# Patient Record
Sex: Female | Born: 1970 | Race: White | Hispanic: No | Marital: Married
Health system: Southern US, Community
[De-identification: ages and names within clinical notes are randomized; demographics above are authoritative.]

---

## 1999-04-04 ENCOUNTER — Other Ambulatory Visit: Admission: RE | Admit: 1999-04-04 | Discharge: 1999-04-04 | Payer: Self-pay | Admitting: Obstetrics and Gynecology

## 2000-06-10 ENCOUNTER — Other Ambulatory Visit: Admission: RE | Admit: 2000-06-10 | Discharge: 2000-06-10 | Payer: Self-pay | Admitting: Obstetrics and Gynecology

## 2001-05-15 ENCOUNTER — Other Ambulatory Visit: Admission: RE | Admit: 2001-05-15 | Discharge: 2001-05-15 | Payer: Self-pay | Admitting: Obstetrics and Gynecology

## 2002-04-23 ENCOUNTER — Inpatient Hospital Stay (HOSPITAL_COMMUNITY): Admission: AD | Admit: 2002-04-23 | Discharge: 2002-04-25 | Payer: Self-pay | Admitting: Obstetrics and Gynecology

## 2002-05-25 ENCOUNTER — Other Ambulatory Visit: Admission: RE | Admit: 2002-05-25 | Discharge: 2002-05-25 | Payer: Self-pay | Admitting: Obstetrics and Gynecology

## 2003-06-23 ENCOUNTER — Other Ambulatory Visit: Admission: RE | Admit: 2003-06-23 | Discharge: 2003-06-23 | Payer: Self-pay | Admitting: Obstetrics and Gynecology

## 2003-11-02 ENCOUNTER — Other Ambulatory Visit: Admission: RE | Admit: 2003-11-02 | Discharge: 2003-11-02 | Payer: Self-pay | Admitting: Obstetrics and Gynecology

## 2004-07-30 ENCOUNTER — Other Ambulatory Visit: Admission: RE | Admit: 2004-07-30 | Discharge: 2004-07-30 | Payer: Self-pay | Admitting: Obstetrics and Gynecology

## 2004-08-15 ENCOUNTER — Ambulatory Visit (HOSPITAL_COMMUNITY): Admission: RE | Admit: 2004-08-15 | Discharge: 2004-08-15 | Payer: Self-pay | Admitting: Obstetrics and Gynecology

## 2004-08-31 ENCOUNTER — Encounter: Payer: Self-pay | Admitting: Obstetrics and Gynecology

## 2004-09-12 ENCOUNTER — Ambulatory Visit (HOSPITAL_COMMUNITY): Admission: RE | Admit: 2004-09-12 | Discharge: 2004-09-12 | Payer: Self-pay | Admitting: Obstetrics and Gynecology

## 2004-11-24 ENCOUNTER — Emergency Department (HOSPITAL_COMMUNITY): Admission: EM | Admit: 2004-11-24 | Discharge: 2004-11-24 | Payer: Self-pay | Admitting: Emergency Medicine

## 2005-08-27 ENCOUNTER — Other Ambulatory Visit: Admission: RE | Admit: 2005-08-27 | Discharge: 2005-08-27 | Payer: Self-pay | Admitting: Obstetrics and Gynecology

## 2006-01-21 ENCOUNTER — Inpatient Hospital Stay (HOSPITAL_COMMUNITY): Admission: AD | Admit: 2006-01-21 | Discharge: 2006-01-26 | Payer: Self-pay | Admitting: Obstetrics and Gynecology

## 2006-01-21 ENCOUNTER — Ambulatory Visit: Payer: Self-pay | Admitting: Neonatology

## 2006-01-22 ENCOUNTER — Encounter (INDEPENDENT_AMBULATORY_CARE_PROVIDER_SITE_OTHER): Payer: Self-pay | Admitting: *Deleted

## 2006-01-27 ENCOUNTER — Encounter: Admission: RE | Admit: 2006-01-27 | Discharge: 2006-02-25 | Payer: Self-pay | Admitting: Obstetrics and Gynecology

## 2006-02-26 ENCOUNTER — Encounter: Admission: RE | Admit: 2006-02-26 | Discharge: 2006-03-28 | Payer: Self-pay | Admitting: Obstetrics and Gynecology

## 2006-03-29 ENCOUNTER — Encounter: Admission: RE | Admit: 2006-03-29 | Discharge: 2006-04-28 | Payer: Self-pay | Admitting: Obstetrics and Gynecology

## 2006-04-29 ENCOUNTER — Encounter: Admission: RE | Admit: 2006-04-29 | Discharge: 2006-05-10 | Payer: Self-pay | Admitting: Obstetrics and Gynecology

## 2007-04-01 ENCOUNTER — Encounter: Admission: RE | Admit: 2007-04-01 | Discharge: 2007-04-01 | Payer: Self-pay | Admitting: Obstetrics and Gynecology

## 2007-11-05 ENCOUNTER — Ambulatory Visit: Payer: Self-pay | Admitting: Family Medicine

## 2007-11-05 DIAGNOSIS — R5381 Other malaise: Secondary | ICD-10-CM

## 2007-11-05 DIAGNOSIS — R51 Headache: Secondary | ICD-10-CM

## 2007-11-05 DIAGNOSIS — R5383 Other fatigue: Secondary | ICD-10-CM

## 2007-11-05 DIAGNOSIS — R519 Headache, unspecified: Secondary | ICD-10-CM | POA: Insufficient documentation

## 2007-11-05 LAB — CONVERTED CEMR LAB
ALT: 9 units/L (ref 0–35)
AST: 13 units/L (ref 0–37)
Albumin: 4.5 g/dL (ref 3.5–5.2)
Alkaline Phosphatase: 51 units/L (ref 39–117)
BUN: 14 mg/dL (ref 6–23)
CO2: 26 meq/L (ref 19–32)
Calcium: 9.5 mg/dL (ref 8.4–10.5)
Chloride: 104 meq/L (ref 96–112)
Cholesterol: 149 mg/dL (ref 0–200)
Creatinine, Ser: 0.8 mg/dL (ref 0.40–1.20)
Glucose, Bld: 91 mg/dL (ref 70–99)
HCT: 43.4 % (ref 36.0–46.0)
HDL: 66 mg/dL (ref >39)
Hemoglobin: 14.1 g/dL (ref 12.0–15.0)
LDL Cholesterol: 72 mg/dL (ref 0–99)
MCHC: 32.5 g/dL (ref 30.0–36.0)
MCV: 93.7 fL (ref 78.0–100.0)
Platelets: 260 10*3/uL (ref 150–400)
Potassium: 4.1 meq/L (ref 3.5–5.3)
RBC: 4.63 M/uL (ref 3.87–5.11)
RDW: 12.7 % (ref 11.5–15.5)
Sed Rate: 4 mm/hr (ref 0–22)
Sodium: 140 meq/L (ref 135–145)
Total Bilirubin: 0.4 mg/dL (ref 0.3–1.2)
Total CHOL/HDL Ratio: 2.3
Total Protein: 7 g/dL (ref 6.0–8.3)
Triglycerides: 54 mg/dL (ref <150)
VLDL: 11 mg/dL (ref 0–40)
Vitamin B-12: 542 pg/mL (ref 211–911)
WBC: 5.3 10*3/uL (ref 4.0–10.5)

## 2007-11-06 ENCOUNTER — Encounter: Payer: Self-pay | Admitting: Family Medicine

## 2008-09-28 ENCOUNTER — Ambulatory Visit: Payer: Self-pay | Admitting: Family Medicine

## 2008-09-28 DIAGNOSIS — J45909 Unspecified asthma, uncomplicated: Secondary | ICD-10-CM | POA: Insufficient documentation

## 2008-09-28 DIAGNOSIS — J209 Acute bronchitis, unspecified: Secondary | ICD-10-CM

## 2010-09-08 ENCOUNTER — Encounter: Payer: Self-pay | Admitting: Obstetrics and Gynecology

## 2010-12-16 ENCOUNTER — Other Ambulatory Visit: Payer: Self-pay | Admitting: Family Medicine

## 2010-12-16 ENCOUNTER — Encounter: Payer: Self-pay | Admitting: Family Medicine

## 2010-12-16 ENCOUNTER — Ambulatory Visit
Admission: RE | Admit: 2010-12-16 | Discharge: 2010-12-16 | Disposition: A | Discharge status: Home / Self Care | Payer: Commercial Indemnity | Source: Ambulatory Visit | Attending: Family Medicine | Admitting: Family Medicine

## 2010-12-16 ENCOUNTER — Inpatient Hospital Stay (INDEPENDENT_AMBULATORY_CARE_PROVIDER_SITE_OTHER)
Admission: RE | Admit: 2010-12-16 | Discharge: 2010-12-16 | Disposition: A | Discharge status: Home / Self Care | Payer: Commercial Indemnity | Source: Ambulatory Visit | Attending: Family Medicine | Admitting: Family Medicine

## 2010-12-16 DIAGNOSIS — M25529 Pain in unspecified elbow: Secondary | ICD-10-CM

## 2010-12-16 DIAGNOSIS — M25429 Effusion, unspecified elbow: Secondary | ICD-10-CM

## 2011-01-04 NOTE — Op Note (Signed)
[name], [name]                 ACCOUNT NO.:  [id number]   MEDICAL RECORD NO.:  [id number]          PATIENT TYPE:  INP   LOCATION:  9108                          FACILITY:  [facility]   PHYSICIAN:  Guy Sandifer. Henderson Cloud, M.D. DATE OF BIRTH:  08/05/1971   DATE OF PROCEDURE:  01/22/2006  DATE OF DISCHARGE:                                 OPERATIVE REPORT   PREOPERATIVE DIAGNOSES:  1.  Twin gestation at 63 weeks' estimated additional age.  2.  Unstable lie for baby B.   POSTOPERATIVE DIAGNOSES:  1.  Twin gestation at 30 weeks' estimated additional age.  2.  Unstable lie for baby B.   PROCEDURE:  Low transverse cesarean section.   SURGEON:  Guy Sandifer. Henderson Cloud, M.D.   ANESTHESIA:  Epidural.   ESTIMATED BLOOD LOSS:  800 mL.   SPECIMENS:  Placentas to pathology.   FINDINGS:  A:  Viable female infant, Apgars of 9 and 9 at one and five  minutes, Birth weight arterial cord pH pending.  B:  Viable female infant,  Apgars of 7 and 9 at one and five minutes. respectively.  Birth weight and  arterial cord pH pending.   INDICATIONS AND CONSENT:  This patient is a 40 year old married white female  G3, P1, at 32-0/7 weeks, who on ultrasound yesterday was noted to have  polyhydramnios of twin B.  Baby B is also smaller than twin A.  Baby A's  estimated fetal weight was 57th percentile, baby B was 15th percentile  estimated fetal weight.  The patient was found to be contracting and was  admitted to the hospital for magnesium sulfate tocolysis.  She subsequently  continued to contract and had spontaneous rupture of membranes for clear  fluid.  She was on Unasyn and had received one dose of betamethasone.  On  examination on the day of surgery she was 7 cm dilated, completely effaced  and -1 station and baby A was vertex.  Ultrasound at the bedside revealed  the baby B to be oblique to almost transverse and back down, with copious  amounts of fluid.  Careful discussion for modes of delivery was  undertaken.  Due to the early gestational age and unstable lie for baby B, cesarean  section is recommended.  Potential risks and complications are reviewed  preoperatively including but not limited to infection, bowel, bladder or  ureteral damage, bleeding requiring transfusion of blood products with  possible transfusion reaction, HIV and hepatitis acquisition, DVT, PE,  pneumonia.  All questions were answered and consent is signed on the chart.   PROCEDURE:  The patient is taken to operating room, where an epidural  anesthetic is augmented to a surgical level.  Foley catheter is already in  place.  The patient is placed in the dorsal supine position with a 15 degree  left lateral wedge.  She is then prepped and draped in a sterile fashion.  After testing for adequate epidural anesthesia, the skin is entered through  a Pfannenstiel incision and dissection is carried out layers to the  peritoneum.  Peritoneum is incised and extended  superiorly and inferiorly.  Vesicouterine peritoneum is taken down cephalolaterally, the bladder flap is  developed and the bladder blade is placed.  The uterus is incised in a low  transverse manner.  The uterine cavity is entered bluntly with a hemostat.  The uterine incision is then extended cephalolaterally with the fingers.  Baby A is then delivered from the vertex presentation.  Good cry and tone is  noted.  The cord is clamped and cut and the baby is handed to the waiting  pediatrics team.  Artificial rupture of membranes on baby B is carried out  for a large amount of fluid.  It is then delivered from the vertex  presentation.  Cry and tone is noted.  Cord is clamped and cut.  The baby is  handed to the waiting pediatrics team.  Arterial blood gas and cord bloods  are drawn.  The cord for baby B is marked with a plastic cord clamp.  Placentas are manually delivered and sent to pathology.  The uterus was  exteriorized.  The cavity is clean.  The uterus  is closed in two running  locking imbricating layers of 0 Monocryl suture.  A figure-of-eight is  placed on the lateral right one-third of the incision to obtain hemostasis.  Right tube and ovary is normal.  The left ovary is normal.  The fimbriae of  the left fallopian tube are clubbed.  There is also a single adhesion at the  end of the left fallopian tube to the omentum.  This is doubly clamped with  hemostats and taken down.  Both pedicles are ligated with 2-0 chromic  suture.  The uterus is returned to the abdomen.  Copious irrigation is  carried out.  Good hemostasis was noted.  The anterior peritoneum is closed  in running fashion with 0 Monocryl suture, which is also used to  reapproximate the pyramidalis muscle in the midline.  Fascia is closed in a  running fashion with 0 PDS suture and the skin is closed with clips.  All  sponge, instrument and needle counts are correct and the patient is  transferred to the recovery room in stable condition.      Guy Sandifer Henderson Cloud, M.D.  Electronically Signed     JET/MEDQ  D:  01/22/2006  T:  01/22/2006  Job:  [id number]

## 2011-01-04 NOTE — Discharge Summary (Signed)
[name], [name]                 ACCOUNT NO.:  [id number]   MEDICAL RECORD NO.:  [id number]          PATIENT TYPE:  INP   LOCATION:  9108                          [facility]ILITY[facility]  [facility]   PHYSICIAN:  Duke Salvia. Marcelle Overlie, M.D.DATE OF BIRTH:  03-25-1971   DATE OF ADMISSION:  01/21/2006  DATE OF DISCHARGE:  01/26/2006                                 DISCHARGE SUMMARY   ADMISSION DIAGNOSES:  1.  Intrauterine pregnancy at 32 weeks estimated gestational age.  2.  Twin gestation.  3.  Preterm labor.   DISCHARGE DIAGNOSES:  1.  Status post low transverse cesarean section.  2.  Viable female infant.   PROCEDURE:  Primary low transverse cesarean section.   REASON FOR ADMISSION:  Please see written H&P.   HOSPITAL COURSE:  The patient is a 40 year old white married female Gravida  3, Para 1, that was admitted to [facility] at 32 weeks  estimated gestational age in preterm labor.  The patient had been seen in  the office where an ultrasound had been performed which had revealed some  polyhydramnios on baby B.  Baby B was also noted to be the smaller of the 2  twins.  Baby A's estimated fetal weight was in the 57th percentile.  Baby B  was in the 15th percentile.  The patient was found to be contracting and was  admitted to the hospital for magnesium sulfate tocolysis.  The patient  continued to contract and sustained spontaneous rupture of membranes.  Fluid  was noted to be clear.  The patient was placed on intravenous antibiotics  and had received 1 dose of betamethasone.  On the day of the surgery, the  patient was examined and found to be 7cm dilated, completely effaced with  baby A which was vertex at a -1 station.  Ultrasound was performed which  revealed baby B to be oblique almost in the transverse back down  presentation.  Baby continued to have copious amounts of amniotic fluid.  Decision was then made to proceed with a primary low transverse cesarean  section due to  early gestational age and unstable lie for baby B.  The  patient was then transferred to the operating room where an epidural was  dosed to an adequate surgical level.  A low transverse incision was made.  The delivery of a viable female, baby A, weighed 3 pounds 15 ounces without  Apgar's  9 at 1 minute and 9 at 5 minutes.  Baby B, also female, was  delivered weighing 3 pounds 5 ounces with Apgar's of 7 at 1 minute and 9 at  5 minutes.  The babies were taken to the NICU and the mother tolerated the  procedure well.  She was taken to the recovery room in stable condition.  Later that evening, the patient was feeling somewhat different.  She stated  she felt tired, but she had some vivid dreams and experienced some nausea  and vomiting and was having some difficulty focusing.   PHYSICAL EXAMINATION ON ADMISSION:  VITAL SIGNS:  Her vital signs were  stable.  She was afebrile.  Oxygen saturation was 96% on room air.  LUNGS:  Lungs were clear to auscultation.  HEART:  Heart was regular rate and rhythm.  ABDOMEN:  Abdomen soft with good return of bowel function.  EXTREMITIES: Deep tendon reflexes were 3 + 1 Beta clona.  Urine output was  good.  PIH (pregnancy induced hypertension) labs were drawn and intravenous  fluid bolus was given.   POSTOPERATIVE EXAMINATIONS:  GENERAL:  On the following morning, the patient  did feel better.  She had ambulated to the bathroom without dizziness.  VITAL SIGNS: Vital signs remained stable.  She was afebrile.  ABDOMEN: Abdomen was soft.  GYN: Fundus was firm and nontender.  SKIN:  Abdominal dressing was clean, dry, and intact.   LABORATORY DATA:  Laboratory findings revealed hemoglobin stable at 8.7,  platelet count of 205,000, WBC count of 11.9.  On postoperative day #2, the  babies were stable in the NICU.  The mother was without complaint.  Vital  signs were stable.  Abdomen soft with good return of bowel function.  Fundus  was firm and nontender.   Incision was clean, dry, and intact.  The patient  was ambulating well.  On postoperative day #3, the patient remained stable.  Vital signs were good.  She was afebrile.  Incision was clean, dry, and  intact.  Fundus firm.  On postoperative day #4, the patient was without  complaint.  Vital signs were stable.  Fundus firm and nontender.  Incision  was clean, dry, and intact.  Staples were removed then the patient was  discharged home.   CONDITION ON DISCHARGE:  Good.   DISCHARGE DIET:  Regular as tolerated.   DISCHARGE ACTIVITIES:  No heavy lifting, no driving x2 weeks, no vaginal  entry.   FOLLOW UP:  Patient is to follow up in the office in 1-2 weeks for an  incision check.  She is to call for temperature greater than 100 degrees,  persistent nausea or vomiting, heavy vaginal bleeding and/or redness or  drainage from the incisional site.   DISCHARGE MEDICATIONS:  1.  Ibuprofen 600 mg every 6 hours.  2.  Prenatal vitamins 1 by mouth daily.  3.  Colace 1 by mouth daily as needed.  4.  Darvocet-N 100 #30, one by mouth every 4-6 hours as needed for pain.      Julio Sicks, N.P.      Richard M. Marcelle Overlie, M.D.  Electronically Signed    CC/MEDQ  D:  02/14/2006  T:  02/14/2006  Job:  [id number]   cc:   Duke Salvia. Marcelle Overlie, M.D.  Fax: [phone]

## 2011-01-04 NOTE — H&P (Signed)
[name], [name]                 ACCOUNT NO.:  [id number]   MEDICAL RECORD NO.:  [id number]          PATIENT TYPE:  MAT   LOCATION:  MATC                          [facility]ILITY:  [facility]   PHYSICIAN:  Dineen Kid. Rana Snare, M.D.    DATE OF BIRTH:  [birth year]   DATE OF ADMISSION:  01/21/2006  DATE OF DISCHARGE:                                HISTORY & PHYSICAL   HISTORY OF PRESENT ILLNESS:  Ms. [name] is a 40 year old G3, P1 at 30 and  six-sevenths weeks who was seen today by Dr. Sherrie George for ultrasound  evaluation of twin pregnancy and polyhydramnios of twin B.  Ultrasound  evaluations today showed fetus #1 on the maternal left in cephalic  presentation with an estimated fetal weight of 57th percentile and normal  amniotic fluid.  Fetus #2 presentation was not documented but previously had  been transverse.  Has an estimated fetal weight of 3 pounds 0 ounces which  gives the baby 15th percentile for fetal weight.  There is a mild to  moderate polyhydramnios noted today, normal umbilical artery ratio of 3.58.  The patient noted to have some irritability versus contractions on fetal  monitoring.  Cervical exam was performed by Dr. Sherrie George showing that cervix  is 3 cm, 75%, and a -3 station.  She was sent to [facility] for  tocolysis and evaluation of preterm labor.  Her pregnancy has been  complicated by marginal previa with the latest ultrasound showing this has  resolved.  The pregnancy is a diamniotic-dichorionic in vitro pregnancy with  an estimated date of confinement of March 19, 2006.  She also had advanced  maternal age with a normal first trimester screen and had declined  amniocentesis.   PAST MEDICAL HISTORY:  Negative.   PAST SURGICAL HISTORY:  Also negative.   PAST OBSTETRICAL HISTORY:  She has had a vaginal delivery, 7-pound 14-ounce  baby in 2003.  She also had a termination in 1993.   PHYSICAL EXAMINATION:  VITAL SIGNS:  Blood pressure 110/60.  HEART:  Regular rate and  rhythm.  PELVIC:  Cervix was closed on Jan 15, 2006, but per Dr. Sherrie George today is 3,  75, -3.   IMPRESSION:  Intrauterine twin pregnancies at 68 and six-sevenths weeks with  cervical change and preterm labor.   PLAN:  Magnesium sulfate tocolysis of labor.  Betamethasone for lung  maturation, 12.5 mg IM x2.  Will go ahead and check a group B strep culture  and place her empirically on IV Unasyn.  Plan bedrest and tocolysis of  labor.  Will continue to monitor the fetuses twice weekly as fetus B does  have polyhydramnios with a 15th percentile estimated fetal weight but  appears to be no evidence of fetal compromise.      Dineen Kid Rana Snare, M.D.  Electronically Signed     DCL/MEDQ  D:  01/21/2006  T:  01/21/2006  Job:  [id number]

## 2011-03-28 ENCOUNTER — Other Ambulatory Visit: Payer: Self-pay | Admitting: Family Medicine

## 2011-03-28 ENCOUNTER — Ambulatory Visit (INDEPENDENT_AMBULATORY_CARE_PROVIDER_SITE_OTHER): Payer: Commercial Indemnity | Admitting: Family Medicine

## 2011-03-28 DIAGNOSIS — N760 Acute vaginitis: Secondary | ICD-10-CM

## 2011-03-28 DIAGNOSIS — N390 Urinary tract infection, site not specified: Secondary | ICD-10-CM

## 2011-03-28 LAB — POCT URINALYSIS DIPSTICK
Blood, UA: NEGATIVE
Glucose, UA: NEGATIVE
Leukocytes, UA: NEGATIVE
Nitrite, UA: NEGATIVE
Spec Grav, UA: >1.03
Urobilinogen, UA: 0.2

## 2011-03-28 NOTE — Progress Notes (Signed)
  Subjective:    Patient ID: [name], female    DOB: 1970-08-29, 40 y.o.   MRN: [id number]  HPI  Itching and some burning x 2-3 days  Review of Systems     Objective:   Physical Exam        Assessment & Plan:

## 2011-03-28 NOTE — Progress Notes (Signed)
  Subjective:    Patient ID: [name], female    DOB: 1971-01-25, 40 y.o.   MRN: [id number]  HPI  Here for viginal itching and dysuria for a couple of days. Recently got off of vacation.  She thinks may have a UTI.   Review of Systems     Objective:   Physical Exam        Assessment & Plan:  UA only + for protein. Will send a culture. Wet prep collected for vaginitis. Should have the results back tomorrow.

## 2011-03-29 LAB — WET PREP, GENITAL
Clue Cells Wet Prep HPF POC: NONE SEEN
Trich, Wet Prep: NONE SEEN
WBC, Wet Prep HPF POC: NONE SEEN
Yeast Wet Prep HPF POC: NONE SEEN

## 2011-03-30 ENCOUNTER — Telehealth: Payer: Self-pay | Admitting: Family Medicine

## 2011-03-30 LAB — URINE CULTURE: Colony Count: 9000

## 2011-03-30 NOTE — Telephone Encounter (Signed)
Call pt: Wet prep and urine cx is neg.

## 2011-04-01 NOTE — Telephone Encounter (Signed)
Pt notified of results. KJ LPN 

## 2011-04-18 ENCOUNTER — Other Ambulatory Visit: Payer: Self-pay | Admitting: Obstetrics and Gynecology

## 2011-04-18 DIAGNOSIS — R928 Other abnormal and inconclusive findings on diagnostic imaging of breast: Secondary | ICD-10-CM

## 2011-04-25 ENCOUNTER — Ambulatory Visit
Admission: RE | Admit: 2011-04-25 | Discharge: 2011-04-25 | Disposition: A | Discharge status: Home / Self Care | Payer: Commercial Indemnity | Source: Ambulatory Visit | Attending: Obstetrics and Gynecology | Admitting: Obstetrics and Gynecology

## 2011-04-25 DIAGNOSIS — R928 Other abnormal and inconclusive findings on diagnostic imaging of breast: Secondary | ICD-10-CM

## 2011-07-22 NOTE — Progress Notes (Signed)
Summary: elbow injury/TM (rm 4)   Vital Signs:  Patient Profile:   40 Years Old Female CC:      left arm pain post fall yesterday Height:     64 inches Weight:      130 pounds O2 Sat:      98 % O2 treatment:    Room Air Temp:     99.4 degrees F oral Pulse rate:   73 / minute Resp:     14 per minute BP sitting:   113 / 72  (right arm) Cuff size:   regular  Vitals Entered By: Lajean Saver RN (December 16, 2010 1:48 PM)                  Updated Prior Medication List: ORTHO TRI-CYCLEN (28) 0.035 MG TABS (NORGESTIMATE-ETHINYL ESTRADIOL) 1 qd  Current Allergies: No known allergies History of Present Illness Chief Complaint: left arm pain post fall yesterday History of Present Illness:  Subjective:  Patient fell in shower yesterday, striking her left elbow.  She complains of persistent pain in her left elbow, and has difficulty fully extending.  REVIEW OF SYSTEMS Constitutional Symptoms      Denies fever, chills, night sweats, weight loss, weight gain, and fatigue.  Eyes       Denies change in vision, eye pain, eye discharge, glasses, contact lenses, and eye surgery. Ear/Nose/Throat/Mouth       Denies hearing loss/aids, change in hearing, ear pain, ear discharge, dizziness, frequent runny nose, frequent nose bleeds, sinus problems, sore throat, hoarseness, and tooth pain or bleeding.  Respiratory       Denies dry cough, productive cough, wheezing, shortness of breath, asthma, bronchitis, and emphysema/COPD.  Cardiovascular       Denies murmurs, chest pain, and tires easily with exhertion.    Gastrointestinal       Denies stomach pain, nausea/vomiting, diarrhea, constipation, blood in bowel movements, and indigestion. Genitourniary       Denies painful urination, kidney stones, and loss of urinary control. Neurological       Denies paralysis, seizures, and fainting/blackouts. Musculoskeletal       Complains of muscle pain, joint pain, joint stiffness, and decreased range  of motion.      Denies redness, swelling, muscle weakness, and gout.      Comments: left arm Skin       Denies bruising, unusual mles/lumps or sores, and hair/skin or nail changes.  Psych       Denies mood changes, temper/anger issues, anxiety/stress, speech problems, depression, and sleep problems. Other Comments: Patient fell in shower yesterday hittingher left elbow. She used ice and naproxyn. Her pain is inher upper arm today and she is unable to extend her arm   Past History:  Past Medical History: Unremarkable  Past Surgical History: Reviewed history from 09/28/2008 and no changes required. c/sec 01/2006 Caesarean section  Family History: Reviewed history from 11/05/2007 and no changes required. Father is Deniece Ree with MI at 64  Social History: Reviewed history from 11/05/2007 and no changes required. Print production planner at [employer]. Bachelors degree.  Married to [place] with 3 children.   Never Smoked Alcohol use-no Drug use-no Regular exercise-yes, runs for 30 min 3 days a week   Objective: Appearance:  Patient appears healthy, stated age, and in no acute distress  Left elbow:  No swelling or deformity.  Has difficulty fully extending actively.  Mild tenderness in olecranon area.  Distal neurovascular intact  X-ray left elbow:  IMPRESSION: No  definite fracture but the presence of joint effusion suggests an occult fracture presumably of the radial head. Assessment New Problems: EFFUSION OF UPPER ARM JOINT (ICD-719.02) ELBOW PAIN, LEFT (ICD-719.42)  SUSPECT LEFT RADIAL HEAD FRACTURE  Plan New Orders: T-DG Elbow Complete*L* [73080] Ulner Gutter Splint [29125] Slings- All Types [A4565] Services provided After hours-Weekends-Holidays [99051] Est. Patient Level IV [16109] Planning Comments:   Applied gutter splint and sling. Continue Naproxen.  Continue ice packs.   Arrange follow-up with orthopedist as soon as possible   The patient and/or caregiver has been  counseled thoroughly with regard to medications prescribed including dosage, schedule, interactions, rationale for use, and possible side effects and they verbalize understanding.  Diagnoses and expected course of recovery discussed and will return if not improved as expected or if the condition worsens. Patient and/or caregiver verbalized understanding.   Orders Added: 1)  T-DG Elbow Complete*L* [73080] 2)  Ulner Gutter Splint [29125] 3)  Slings- All Types [A4565] 4)  Services provided After hours-Weekends-Holidays [99051] 5)  Est. Patient Level IV [60454]

## 2012-06-02 ENCOUNTER — Other Ambulatory Visit: Payer: Self-pay | Admitting: Obstetrics and Gynecology

## 2013-06-09 ENCOUNTER — Other Ambulatory Visit: Payer: Self-pay | Admitting: Obstetrics and Gynecology

## 2013-06-15 ENCOUNTER — Other Ambulatory Visit: Payer: Self-pay | Admitting: Obstetrics and Gynecology

## 2013-06-15 DIAGNOSIS — R928 Other abnormal and inconclusive findings on diagnostic imaging of breast: Secondary | ICD-10-CM

## 2013-07-01 ENCOUNTER — Ambulatory Visit
Admission: RE | Admit: 2013-07-01 | Discharge: 2013-07-01 | Disposition: A | Discharge status: Home / Self Care | Payer: BC Managed Care – PPO | Source: Ambulatory Visit | Attending: Obstetrics and Gynecology | Admitting: Obstetrics and Gynecology

## 2013-07-01 DIAGNOSIS — R928 Other abnormal and inconclusive findings on diagnostic imaging of breast: Secondary | ICD-10-CM

## 2013-07-06 ENCOUNTER — Other Ambulatory Visit: Payer: Commercial Indemnity

## 2014-07-19 ENCOUNTER — Other Ambulatory Visit: Payer: Self-pay | Admitting: Obstetrics and Gynecology

## 2014-07-20 LAB — CYTOLOGY - PAP

## 2014-09-01 IMAGING — MG MM DIGITAL DIAGNOSTIC UNILAT*R*
2 series · 2 of 2 positions shown · non-contrast
Comparison: Multiple priors

CLINICAL DATA: Abnormal screening mammogram.

EXAM:
DIGITAL DIAGNOSTIC  RIGHT MAMMOGRAM

[R MLO]
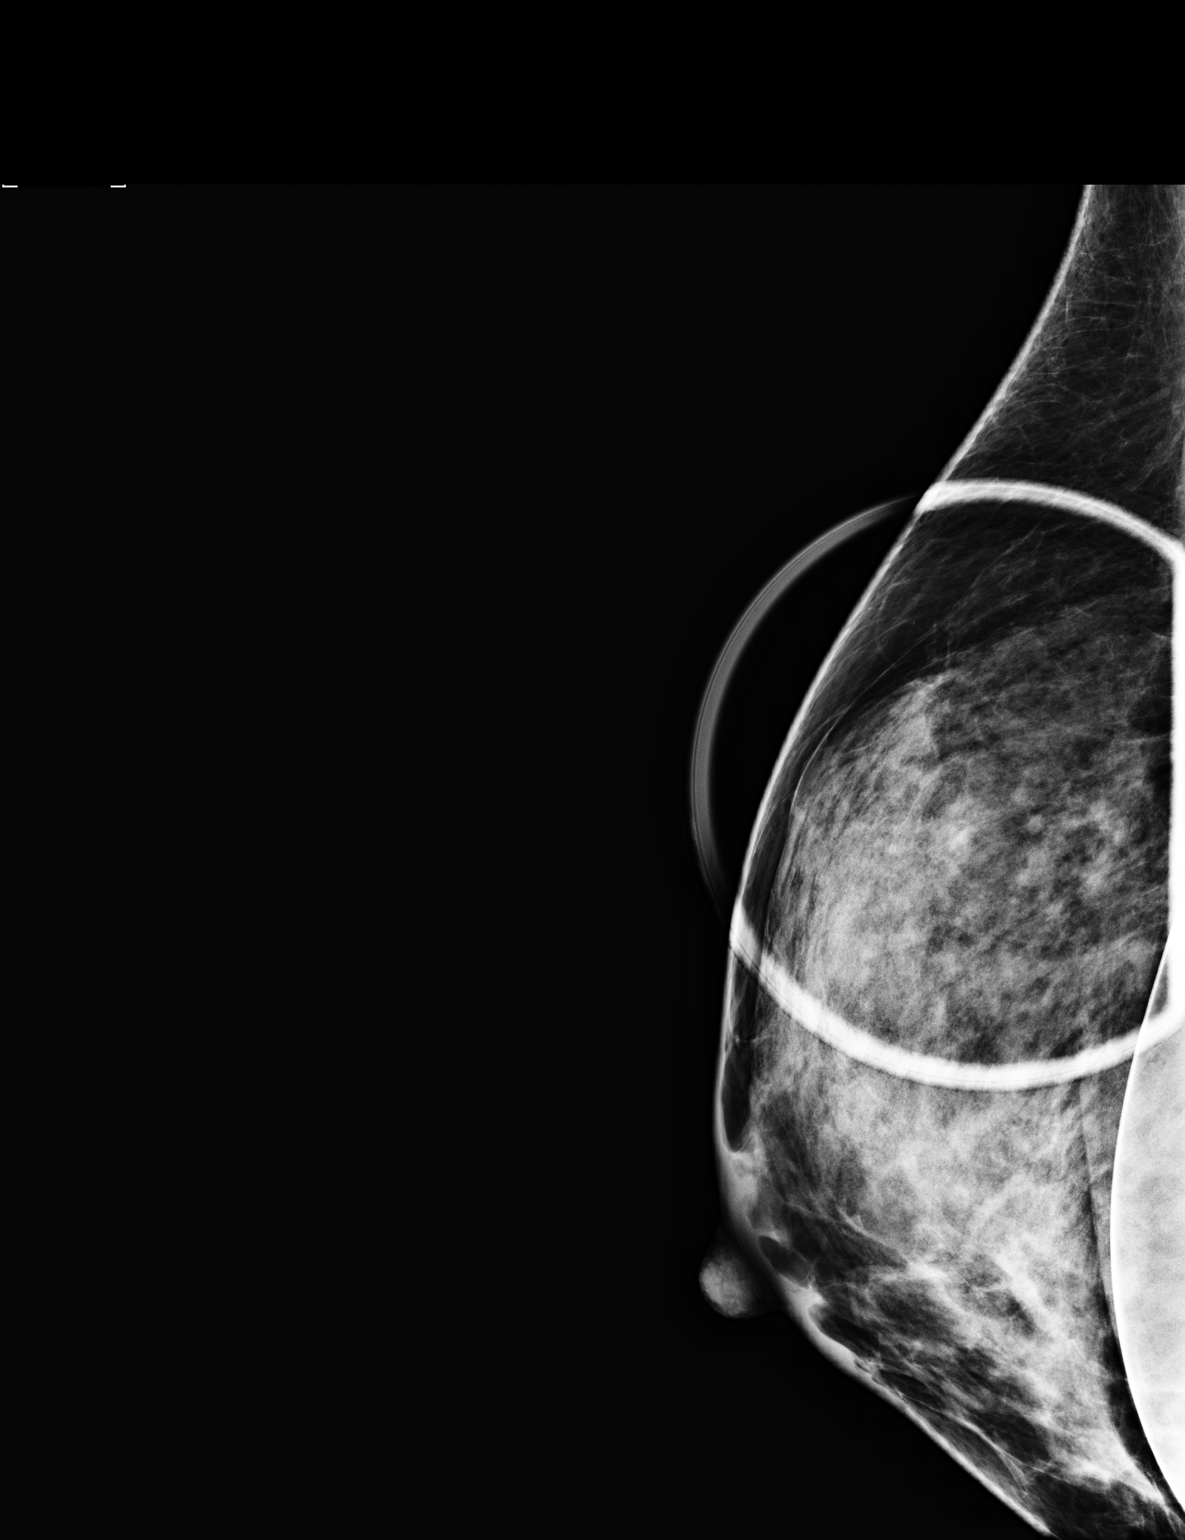

[R ML]
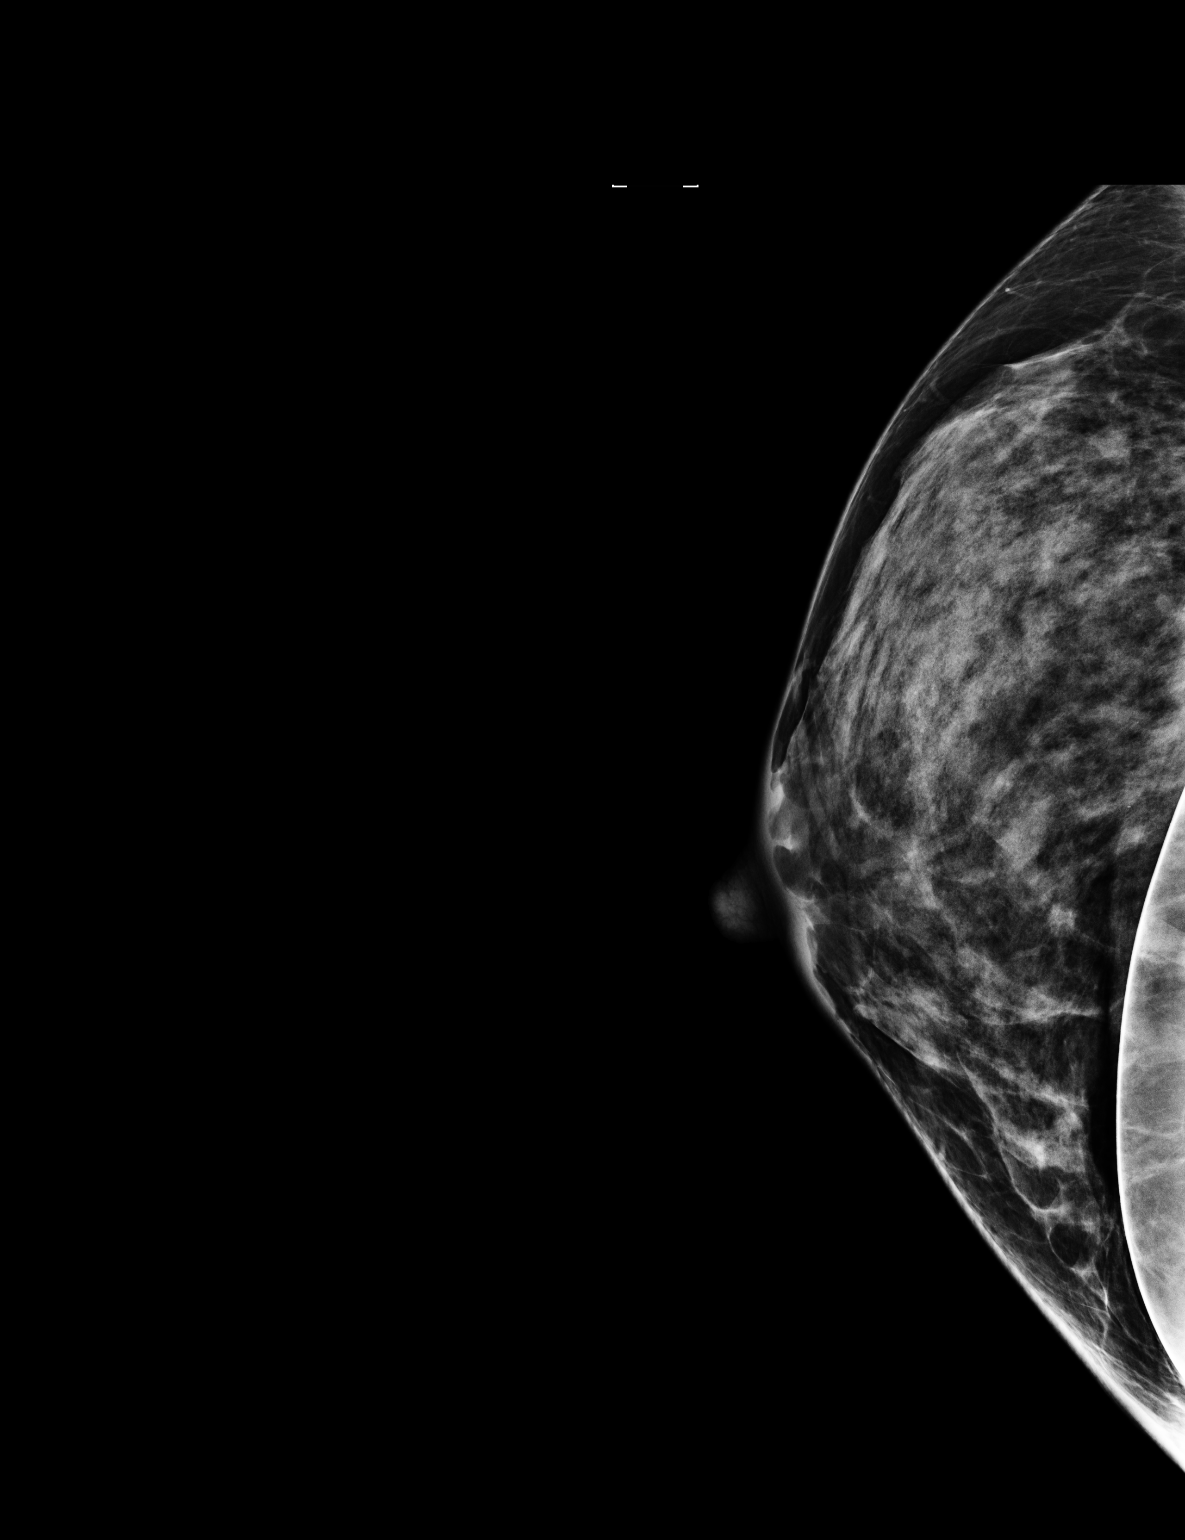

[2 of 2 positions shown; findings below may reference images not displayed]

ACR Breast Density Category c: The breast tissue is heterogeneously
dense, which may obscure small masses.
FINDINGS: The previously questioned mass in the upper right breast disperses
with additional imaging.
IMPRESSION: Negative examination.

RECOMMENDATION:
Screening mammogram in one year.(Code:[id number])

I have discussed the findings and recommendations with the patient.
Results were also provided in writing at the conclusion of the
visit. If applicable, a reminder letter will be sent to the patient
regarding the next appointment.

BI-RADS CATEGORY  1: Negative

## 2020-02-27 ENCOUNTER — Other Ambulatory Visit: Payer: Self-pay

## 2020-02-27 ENCOUNTER — Emergency Department (HOSPITAL_BASED_OUTPATIENT_CLINIC_OR_DEPARTMENT_OTHER)
Admission: EM | Admit: 2020-02-27 | Discharge: 2020-02-28 | Disposition: A | Discharge status: Home / Self Care | Payer: BC Managed Care – PPO | Attending: Emergency Medicine | Admitting: Emergency Medicine

## 2020-02-27 ENCOUNTER — Encounter (HOSPITAL_BASED_OUTPATIENT_CLINIC_OR_DEPARTMENT_OTHER): Payer: Self-pay | Admitting: Emergency Medicine

## 2020-02-27 DIAGNOSIS — R0602 Shortness of breath: Secondary | ICD-10-CM | POA: Diagnosis not present

## 2020-02-27 DIAGNOSIS — R531 Weakness: Secondary | ICD-10-CM | POA: Diagnosis present

## 2020-02-27 DIAGNOSIS — E119 Type 2 diabetes mellitus without complications: Secondary | ICD-10-CM | POA: Diagnosis not present

## 2020-02-27 DIAGNOSIS — T63424A Toxic effect of venom of ants, undetermined, initial encounter: Secondary | ICD-10-CM

## 2020-02-27 DIAGNOSIS — E162 Hypoglycemia, unspecified: Secondary | ICD-10-CM | POA: Insufficient documentation

## 2020-02-27 NOTE — ED Triage Notes (Signed)
Pt reports ant bites to feet yesterday. Reports tingling/numbness. Ambulatory at a steady gait.

## 2020-02-27 NOTE — ED Provider Notes (Signed)
[facility] EMERGENCY DEPARTMENT Provider Note   CSN: [id number] Arrival date & time: 02/27/20  2113     History Chief Complaint  Patient presents with  . Insect Bite    [name] is a 49 y.o. female.  HPI     This a 49 year old female who presents with ant bites.  Patient reports that she was at her son's game in [place] yesterday when she sustained multiple ant bites to the left foot.  Since that time she has had itching and swelling to the left foot.  She got concerned when the swelling migrated to the left ankle.  She is not noted any redness.  No systemic symptoms or fevers.  She has been taking Benadryl with minimal relief.  No other complaints including nausea, vomiting, difficulty breathing, systemic symptoms.   History reviewed. No pertinent past medical history.  Patient Active Problem List   Diagnosis Date Noted  . EFFUSION OF UPPER ARM JOINT 12/16/2010  . BRONCHITIS, ACUTE 09/28/2008  . 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 FATIGUE 11/05/2007  . HEADACHE 11/05/2007     OB History   No obstetric history on file.     History reviewed. No pertinent family history.  Social History   Tobacco Use  . Smoking status: Not on file  Substance Use Topics  . Alcohol use: Not on file  . Drug use: Not on file    Home Medications Prior to Admission medications   Not on File    Allergies    Patient has no known allergies.  Review of Systems   Review of Systems  Constitutional: Negative for fever.  Respiratory: Negative for shortness of breath.   Cardiovascular: Negative for chest pain.  Skin: Negative for color change.       Multiple bites on the left foot  All other systems reviewed and are negative.   Physical Exam Updated Vital Signs BP 130/70   Pulse 69   Temp 98.5 F (36.9 C) (Oral)   Resp 16   SpO2 98%   Physical Exam Vitals and nursing note reviewed.  Constitutional:      Appearance: She is well-developed. She is not  ill-appearing.  HENT:     Head: Normocephalic and atraumatic.     Mouth/Throat:     Mouth: Mucous membranes are moist.  Cardiovascular:     Rate and Rhythm: Normal rate and regular rhythm.  Pulmonary:     Effort: Pulmonary effort is normal. No respiratory distress.  Musculoskeletal:     Comments: Slight swelling noted to the left foot to the ankle, no obvious deformity, 2+ DP pulse  Skin:    General: Skin is warm and dry.     Comments: 4-5 bites over the medial aspect of the left foot, serosanguineous weeping noted, no significant erythema  Neurological:     Mental Status: She is alert and oriented to person, place, and time.  Psychiatric:        Mood and Affect: Mood normal.     ED Results / Procedures / Treatments   Labs (all labs ordered are listed, but only abnormal results are displayed) Labs Reviewed - No data to display  EKG None  Radiology No results found.  Procedures Procedures (including critical care time)  Medications Ordered in ED Medications - No data to display  ED Course  I have reviewed the triage vital signs and the nursing notes.  Pertinent labs & imaging results that were available during my care of  the patient were reviewed by me and considered in my medical decision making (see chart for details).    MDM Rules/Calculators/A&P                          Patient presents with fire ant bites to left foot.  She is overall nontoxic and vital signs are reassuring.  No systemic symptoms.  She appears to have a local reaction to several ant bites on the left foot.  They are now slightly weeping but no significant erythema, mild swelling of the foot.  Appears to be a local reaction.  Discussed with patient that she can keep the foot elevated and place ice on the foot.  Would avoid topical hydrocortisone at this point given that the bites are weeping slightly.  She can apply antibiotic ointment.  Continue antihistamines.  Supportive measures  recommended.  After history, exam, and medical workup I feel the patient has been appropriately medically screened and is safe for discharge home. Pertinent diagnoses were discussed with the patient. Patient was given return precautions.    Final Clinical Impression(s) / ED Diagnoses Final diagnoses:  Fire ant bite, undetermined intent, initial encounter    Rx / DC Orders ED Discharge Orders    None       Ondre Salvetti, Mayer Masker, MD 02/27/20 2348

## 2020-02-27 NOTE — Discharge Instructions (Addendum)
You were seen today for ant bites.  There are no signs of infection at this time.  Continue Benadryl as needed.  Keep elevated and you may apply ice.  Given that the bites are now open and oozing, would avoid topical creams.

## 2024-03-25 ENCOUNTER — Emergency Department (HOSPITAL_COMMUNITY)

## 2024-03-25 ENCOUNTER — Emergency Department (HOSPITAL_COMMUNITY)
Admission: EM | Admit: 2024-03-25 | Discharge: 2024-03-25 | Disposition: A | Discharge status: Home / Self Care | Attending: Emergency Medicine | Admitting: Emergency Medicine

## 2024-03-25 ENCOUNTER — Other Ambulatory Visit: Payer: Self-pay

## 2024-03-25 DIAGNOSIS — D72829 Elevated white blood cell count, unspecified: Secondary | ICD-10-CM | POA: Diagnosis not present

## 2024-03-25 DIAGNOSIS — N210 Calculus in bladder: Secondary | ICD-10-CM | POA: Insufficient documentation

## 2024-03-25 DIAGNOSIS — R339 Retention of urine, unspecified: Secondary | ICD-10-CM | POA: Diagnosis present

## 2024-03-25 LAB — CBC WITH DIFFERENTIAL/PLATELET
Abs Immature Granulocytes: 0.08 K/uL — ABNORMAL HIGH (ref 0.00–0.07)
Basophils Absolute: 0 K/uL (ref 0.0–0.1)
Basophils Relative: 0 %
Eosinophils Absolute: 0 K/uL (ref 0.0–0.5)
Eosinophils Relative: 0 %
HCT: 41.8 % (ref 36.0–46.0)
Hemoglobin: 14.1 g/dL (ref 12.0–15.0)
Immature Granulocytes: 1 %
Lymphocytes Relative: 4 %
Lymphs Abs: 0.6 K/uL — ABNORMAL LOW (ref 0.7–4.0)
MCH: 31.6 pg (ref 26.0–34.0)
MCHC: 33.7 g/dL (ref 30.0–36.0)
MCV: 93.7 fL (ref 80.0–100.0)
Monocytes Absolute: 0.5 K/uL (ref 0.1–1.0)
Monocytes Relative: 3 %
Neutro Abs: 13.3 K/uL — ABNORMAL HIGH (ref 1.7–7.7)
Neutrophils Relative %: 92 %
Platelets: 296 K/uL (ref 150–400)
RBC: 4.46 MIL/uL (ref 3.87–5.11)
RDW: 12.6 % (ref 11.5–15.5)
WBC: 14.5 K/uL — ABNORMAL HIGH (ref 4.0–10.5)
nRBC: 0 % (ref 0.0–0.2)

## 2024-03-25 LAB — COMPREHENSIVE METABOLIC PANEL WITH GFR
ALT: 18 U/L (ref 0–44)
AST: 23 U/L (ref 15–41)
Albumin: 4.1 g/dL (ref 3.5–5.0)
Alkaline Phosphatase: 36 U/L — ABNORMAL LOW (ref 38–126)
Anion gap: 9 (ref 5–15)
BUN: 15 mg/dL (ref 6–20)
CO2: 21 mmol/L — ABNORMAL LOW (ref 22–32)
Calcium: 9.2 mg/dL (ref 8.9–10.3)
Chloride: 107 mmol/L (ref 98–111)
Creatinine, Ser: 0.85 mg/dL (ref 0.44–1.00)
GFR, Estimated: >60 mL/min (ref >60)
Glucose, Bld: 103 mg/dL — ABNORMAL HIGH (ref 70–99)
Potassium: 4 mmol/L (ref 3.5–5.1)
Sodium: 137 mmol/L (ref 135–145)
Total Bilirubin: 0.5 mg/dL (ref 0.0–1.2)
Total Protein: 6.7 g/dL (ref 6.5–8.1)

## 2024-03-25 LAB — URINALYSIS, W/ REFLEX TO CULTURE (INFECTION SUSPECTED)
Bilirubin Urine: NEGATIVE
Glucose, UA: NEGATIVE mg/dL
Hgb urine dipstick: NEGATIVE
Ketones, ur: 20 mg/dL — AB
Leukocytes,Ua: NEGATIVE
Nitrite: NEGATIVE
Protein, ur: NEGATIVE mg/dL
Specific Gravity, Urine: 1.012 (ref 1.005–1.030)
pH: 6 (ref 5.0–8.0)

## 2024-03-25 LAB — LIPASE, BLOOD: Lipase: 35 U/L (ref 11–51)

## 2024-03-25 MED ORDER — SODIUM CHLORIDE 0.9 % IV SOLN: 1.0000 g | Freq: Once | INTRAVENOUS | Status: DC

## 2024-03-25 MED ORDER — ONDANSETRON HCL 4 MG/2ML IJ SOLN: 4.0000 mg | Freq: Four times a day (QID) | INTRAMUSCULAR | Status: DC | PRN

## 2024-03-25 MED ORDER — MORPHINE SULFATE (PF) 4 MG/ML IV SOLN: 4.0000 mg | INTRAVENOUS | Status: DC | PRN

## 2024-03-25 MED ORDER — LACTATED RINGERS IV BOLUS
1000.0000 mL | Freq: Once | INTRAVENOUS | Status: AC
  Administered 2024-03-25: 1000 mL via INTRAVENOUS

## 2024-03-25 NOTE — ED Provider Notes (Signed)
 [place] [facility] Provider Note   CSN: [id number] Arrival date & time: 03/25/24  1701     History  Chief Complaint  Patient presents with   Urinary Retention    [name] is a 53 y.o. female with PMH as listed below who presents from [facility] via [facility] with a c/o urinary retention. Issue started at 7am, PT felt pressure in lower front(stabbing) and right lower back. No h/o similar. When attempting to urinate, it just felt heavy and only dribbles came out.. Pain reported at physician office was a 10 and PT was given 30mg  of Toradol which helped. PT also had a period of n/v which caused her to be dizzy. 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 H/o partial hysterectomy; no vaginal sxs. No dysuria/hematuria. No fevers but does endorse chills. No h/o pelvic organ prolapse.  No past medical history on file.     Home Medications Prior to Admission medications   Not on File      Allergies    Amoxicillin    Review of Systems   Review of Systems A 10 point review of systems was performed and is negative unless otherwise reported in HPI.  Physical Exam Updated Vital Signs BP (!) 112/57 (BP Location: Right Arm)   Pulse 68   Temp 98.7 F (37.1 C) (Oral)   Resp 17   Ht 5' 4 (1.626 m)   Wt 59 kg   SpO2 99%   BMI 22.33 kg/m  Physical Exam General: Normal appearing female, lying in bed.  HEENT: PERRLA, Sclera anicteric, MMM, trachea midline.  Cardiology: RRR, no murmurs/rubs/gallops.  Resp: Normal respiratory rate and effort. CTAB, no wheezes, rhonchi, crackles.  Abd: Soft, non-tender, non-distended. No rebound tenderness or guarding.  GU: Deferred. MSK: No peripheral edema or signs of trauma. Extremities without deformity or TTP. No cyanosis or clubbing. Skin: warm, dry. Back: +R CVA TTP Neuro: A&Ox4, CNs II-XII grossly intact. MAEs. Sensation grossly intact.  Psych: Normal mood  and affect.   ED Results / Procedures / Treatments   Labs (all labs ordered are listed, but only abnormal results are displayed) Labs Reviewed  CBC WITH DIFFERENTIAL/PLATELET - Abnormal; Notable for the following components:      Result Value   WBC 14.5 (*)    Neutro Abs 13.3 (*)    Lymphs Abs 0.6 (*)    Abs Immature Granulocytes 0.08 (*)    All other components within normal limits  COMPREHENSIVE METABOLIC PANEL WITH GFR - Abnormal; Notable for the following components:   CO2 21 (*)    Glucose, Bld 103 (*)    Alkaline Phosphatase 36 (*)    All other components within normal limits  URINALYSIS, W/ REFLEX TO CULTURE (INFECTION SUSPECTED) - Abnormal; Notable for the following components:   Color, Urine AMBER (*)    APPearance CLOUDY (*)    Ketones, ur 20 (*)    Bacteria, UA RARE (*)    All other components within normal limits  URINE CULTURE  LIPASE, BLOOD    EKG None  Radiology CT Renal Stone Study Result Date: 03/25/2024 CLINICAL DATA:  Lower pelvic pain and urinary retention EXAM: CT ABDOMEN AND PELVIS WITHOUT CONTRAST TECHNIQUE: Multidetector CT imaging of the abdomen and pelvis was performed following the standard protocol without IV contrast. RADIATION DOSE REDUCTION: This exam was performed according to the departmental dose-optimization program which includes automated exposure control, adjustment  of the mA and/or kV according to patient size and/or use of iterative reconstruction technique. COMPARISON:  None Available. FINDINGS: Lower chest: No focal consolidation or pulmonary nodule in the lung bases. No pleural effusion or pneumothorax demonstrated. Partially imaged heart size is normal. Partially imaged bilateral breast implants Hepatobiliary: No focal hepatic lesions. No intra or extrahepatic biliary ductal dilation. Cholelithiasis Pancreas: No focal lesions or main ductal dilation. Spleen: Normal in size without focal abnormality. Adrenals/Urinary Tract: No adrenal  nodules. Mild right hydroureteronephrosis superimposed on extrarenal pelvis. No obstructing stones. 2 mm dependent stone within the midline urinary bladder (6:47). Punctate nonobstructing left lower pole renal stone. No left hydronephrosis. No focal bladder wall thickening. Stomach/Bowel: Normal appearance of the stomach. No evidence of bowel wall thickening, distention, or inflammatory changes. Normal appendix. Vascular/Lymphatic: Aortic atherosclerosis. No enlarged abdominal or pelvic lymph nodes. Reproductive: No adnexal masses. Other: No free fluid, fluid collection, or free air. Musculoskeletal: No acute or abnormal lytic or blastic osseous lesions. Calcified and noncalcified bilateral gluteal subcutaneous granulomata. IMPRESSION: 1. Findings of a recently passed right renal stone in the bladder with residual mild right hydroureteronephrosis. 2. Punctate nonobstructing left lower pole renal stone. 3. Cholelithiasis. 4.  Aortic Atherosclerosis (ICD10-I70.0). Electronically Signed   By: Limin  Xu M.D.   On: 03/25/2024 18:43    Procedures Procedures    Medications Ordered in ED Medications  ondansetron  (ZOFRAN ) injection 4 mg (has no administration in time range)  morphine  (PF) 4 MG/ML injection 4 mg (has no administration in time range)  lactated ringers  bolus 1,000 mL (1,000 mLs Intravenous New Bag/Given 03/25/24 1829)    ED Course/ Medical Decision Making/ A&P                          Medical Decision Making Amount and/or Complexity of Data Reviewed Labs: ordered. Decision-making details documented in ED Course. Radiology: ordered. Decision-making details documented in ED Course.  Risk Prescription drug management.    This patient presents to the ED for concern of difficulty urinating with abd/R flank pain, this involves an extensive number of treatment options, and is a complaint that carries with it a high risk of complications and morbidity.  I considered the following differential  and admission for this acute, potentially life threatening condition.   MDM:    Consider UTI, pyelonephritis, urolithiasis, urinary retention.  Bladder scan demonstrates only 150 cc in the bladder.  She does have a leukocytosis but reassuringly no UTI on UA.  CT renal stone demonstrates a recently passed right renal stone in the bladder with residual mild right hydroureteronephrosis.  Patient's symptoms have not recurred, she states she feels mildly sore but after the Toradol given in the clinic she does not feel any significant pain.  I believe that the patient's symptoms came from this recently passed stone.  I informed the patient of this as well as of her left lower pole renal stone and gallstones.  I doubt symptoms from either 1 of these pathologies.  Patient is instructed to stay well-hydrated at home and to follow-up with her primary care physician within 1 to 2 weeks if her symptoms recur.  Clinical Course as of 03/25/24 2042  Thu Mar 25, 2024  1846 WBC(!): 14.5 +leukocytosis w/ left shift [HN]  1846 CT Renal Stone Study 1. Findings of a recently passed right renal stone in the bladder with residual mild right hydroureteronephrosis. 2. Punctate nonobstructing left lower pole renal stone. 3. Cholelithiasis. 4.  Aortic Atherosclerosis (ICD10-I70.0).   [HN]  1847 Will obtain urinalysis w/ urine culture [HN]  2035 Bladder scan showed 150cc pre-void.  [HN]  2035 No UTI on UA. [HN]    Clinical Course User Index [HN] Franklyn Sid SAILOR, MD    Labs: I Ordered, and personally interpreted labs.  The pertinent results include:  those listed above  Imaging Studies ordered: I ordered imaging studies including CT renal stone study I independently visualized and interpreted imaging. I agree with the radiologist interpretation  Additional history obtained from chart review, partner at bedside  Cardiac Monitoring: The patient was maintained on a cardiac monitor.  I personally viewed and  interpreted the cardiac monitored which showed an underlying rhythm of: Normal sinus rhythm  Reevaluation: After the interventions noted above, I reevaluated the patient and found that they have :resolved  Social Determinants of Health:  lives independently  Disposition:  DC w/ discharge instructions/return precautions. All questions answered to patient's satisfaction.    Co morbidities that complicate the patient evaluation No past medical history on file.   Medicines Meds ordered this encounter  Medications   lactated ringers  bolus 1,000 mL   ondansetron  (ZOFRAN ) injection 4 mg   morphine  (PF) 4 MG/ML injection 4 mg   DISCONTD: cefTRIAXone (ROCEPHIN) 1 g in sodium chloride  0.9 % 100 mL IVPB    Antibiotic Indication::   UTI    I have reviewed the patients home medicines and have made adjustments as needed  Problem List / ED Course: Problem List Items Addressed This Visit   None Visit Diagnoses       Calculus of urinary bladder    -  Primary   Relevant Medications   morphine  (PF) 4 MG/ML injection 4 mg                   This note was created using dictation software, which may contain spelling or grammatical errors.    Franklyn Sid SAILOR, MD 03/25/24 [id number]

## 2024-03-25 NOTE — Discharge Instructions (Addendum)
 Thank you for coming to [facility] Emergency Department. You were seen for pain in your lower back and difficulty urinating.  This was caused by passing a kidney stone from your ureter into your bladder.  Now that the very small 2 mm stone is in your bladder you will likely not feel it when it passes out into the world through your urethra.  Please follow-up with your doctor if you have any recurrent symptoms.  Do not hesitate to return to the ED or call 911 if you experience: -Worsening symptoms -Lightheadedness, passing out -Fevers/chills -Anything else that concerns you

## 2024-03-25 NOTE — ED Triage Notes (Signed)
 PT arrived from [facility] via [facility] with a c/o urinary retention. Issue started at 7am, PT felt pressurein lowere front(stabbing) and lower back(muscle). When attempting to urinate, it just felt heavy and only dribbles came out.. Pain reported at physician office was a 10 and PT was given 30mg  of Toradol which helped. PT aslso had a period of n/v which caused her to be dizzy. 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 Bp:116/76 P:65 cbg:133

## 2024-03-26 LAB — URINE CULTURE: Culture: NO GROWTH
